# Patient Record
Sex: Male | Born: 1990 | Race: Black or African American | Hispanic: No | Marital: Single | Smoking: Current every day smoker
Health system: Southern US, Community
[De-identification: ages and names within clinical notes are randomized; demographics above are authoritative.]

## PROBLEM LIST (undated history)

## (undated) DIAGNOSIS — J45909 Unspecified asthma, uncomplicated: Secondary | ICD-10-CM

## (undated) HISTORY — PX: TONSILLECTOMY: SUR1361

---
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
## 2009-08-08 ENCOUNTER — Emergency Department (HOSPITAL_COMMUNITY): Admission: EM | Admit: 2009-08-08 | Discharge: 2009-08-08 | Payer: Self-pay | Admitting: Emergency Medicine

## 2010-04-17 ENCOUNTER — Emergency Department (HOSPITAL_COMMUNITY)
Admission: EM | Admit: 2010-04-17 | Discharge: 2010-04-17 | Disposition: A | Discharge status: Home / Self Care | Payer: No Typology Code available for payment source | Attending: Emergency Medicine | Admitting: Emergency Medicine

## 2010-04-17 ENCOUNTER — Emergency Department (HOSPITAL_COMMUNITY)
Admit: 2010-04-17 | Discharge: 2010-04-17 | Disposition: A | Discharge status: Home / Self Care | Payer: No Typology Code available for payment source

## 2010-04-17 ENCOUNTER — Encounter (HOSPITAL_COMMUNITY): Payer: Self-pay

## 2010-04-17 DIAGNOSIS — Z09 Encounter for follow-up examination after completed treatment for conditions other than malignant neoplasm: Secondary | ICD-10-CM | POA: Insufficient documentation

## 2010-04-17 DIAGNOSIS — T07XXXA Unspecified multiple injuries, initial encounter: Secondary | ICD-10-CM | POA: Insufficient documentation

## 2010-04-19 ENCOUNTER — Emergency Department (HOSPITAL_COMMUNITY)
Admission: EM | Admit: 2010-04-19 | Discharge: 2010-04-19 | Disposition: A | Discharge status: Home / Self Care | Payer: No Typology Code available for payment source | Attending: Emergency Medicine | Admitting: Emergency Medicine

## 2010-04-19 DIAGNOSIS — R51 Headache: Secondary | ICD-10-CM | POA: Insufficient documentation

## 2010-04-19 DIAGNOSIS — M545 Low back pain, unspecified: Secondary | ICD-10-CM | POA: Insufficient documentation

## 2010-04-19 DIAGNOSIS — M546 Pain in thoracic spine: Secondary | ICD-10-CM | POA: Insufficient documentation

## 2010-04-19 DIAGNOSIS — J45909 Unspecified asthma, uncomplicated: Secondary | ICD-10-CM | POA: Insufficient documentation

## 2010-04-19 DIAGNOSIS — Y929 Unspecified place or not applicable: Secondary | ICD-10-CM | POA: Insufficient documentation

## 2010-04-19 DIAGNOSIS — T07XXXA Unspecified multiple injuries, initial encounter: Secondary | ICD-10-CM | POA: Insufficient documentation

## 2010-04-19 DIAGNOSIS — M542 Cervicalgia: Secondary | ICD-10-CM | POA: Insufficient documentation

## 2011-12-21 ENCOUNTER — Emergency Department (HOSPITAL_COMMUNITY)
Admission: EM | Admit: 2011-12-21 | Discharge: 2011-12-21 | Disposition: A | Discharge status: Home / Self Care | Payer: Self-pay | Attending: Emergency Medicine | Admitting: Emergency Medicine

## 2011-12-21 ENCOUNTER — Emergency Department (HOSPITAL_COMMUNITY): Payer: Self-pay

## 2011-12-21 ENCOUNTER — Encounter (HOSPITAL_COMMUNITY): Payer: Self-pay | Admitting: *Deleted

## 2011-12-21 DIAGNOSIS — F17200 Nicotine dependence, unspecified, uncomplicated: Secondary | ICD-10-CM | POA: Insufficient documentation

## 2011-12-21 DIAGNOSIS — Y92009 Unspecified place in unspecified non-institutional (private) residence as the place of occurrence of the external cause: Secondary | ICD-10-CM | POA: Insufficient documentation

## 2011-12-21 DIAGNOSIS — S6000XA Contusion of unspecified finger without damage to nail, initial encounter: Secondary | ICD-10-CM | POA: Insufficient documentation

## 2011-12-21 DIAGNOSIS — W230XXA Caught, crushed, jammed, or pinched between moving objects, initial encounter: Secondary | ICD-10-CM | POA: Insufficient documentation

## 2011-12-21 HISTORY — DX: Unspecified asthma, uncomplicated: J45.909

## 2011-12-21 MED ORDER — HYDROCODONE-ACETAMINOPHEN 5-325 MG PO TABS: ORAL_TABLET | ORAL | Status: DC

## 2011-12-21 MED ORDER — NAPROXEN 500 MG PO TABS: 500.0000 mg | ORAL_TABLET | Freq: Two times a day (BID) | ORAL | Status: DC

## 2011-12-21 NOTE — ED Provider Notes (Signed)
Medical screening examination/treatment/procedure(s) were performed by non-physician practitioner and as supervising physician I was immediately available for consultation/collaboration.   Charles B. Sheldon, MD 12/21/11 2135 

## 2011-12-21 NOTE — ED Provider Notes (Signed)
History     CSN: [id number]  Arrival date & time 12/21/11  1534   First MD Initiated Contact with Patient 12/21/11 1606      Chief Complaint  Patient presents with  . Hand Injury    (Consider location/radiation/quality/duration/timing/severity/associated sxs/prior treatment) HPI Comments: Patient c/o injury to his right hand last week and accidentally shut his hand in a car door earlier today.  C/o pain to the PIP joints of the second and third fingers.  Pain to the index finger radiates to the dorsal hand.  Pain is worse with flexion of the fingers.  He has not been taking any medication of the pain.  He denies numbness, bleeding, or wrist pain.    Patient is a 21 y.o. male presenting with hand injury. The history is provided by the patient.  Hand Injury  The incident occurred 3 to 5 hours ago. The incident occurred at home. The injury mechanism was a direct blow. The pain is present in the right fingers. The quality of the pain is described as aching and throbbing. The pain is moderate. The pain has been constant since the incident. Pertinent negatives include no malaise/fatigue. He reports no foreign bodies present. The symptoms are aggravated by movement, use and palpation. He has tried nothing for the symptoms. The treatment provided no relief.    Past Medical History  Diagnosis Date  . Asthma     Past Surgical History  Procedure Date  . Tonsillectomy     History reviewed. No pertinent family history.  History  Substance Use Topics  . Smoking status: Current Every Day Smoker  . Smokeless tobacco: Not on file  . Alcohol Use: No      Review of Systems  Constitutional: Negative for malaise/fatigue, activity change and appetite change.  HENT: Negative for neck pain and neck stiffness.   Respiratory: Negative for shortness of breath.   Cardiovascular: Negative for chest pain.  Gastrointestinal: Negative for nausea and vomiting.  Musculoskeletal: Positive for  arthralgias.  Skin: Negative for rash.       abrasions  Neurological: Negative for dizziness, speech difficulty, weakness, numbness and headaches.  Psychiatric/Behavioral: Negative for confusion and decreased concentration.  All other systems reviewed and are negative.    Allergies  Review of patient's allergies indicates no known allergies.  Home Medications  No current outpatient prescriptions on file.  BP 119/69  Pulse 60  Temp 98.1 F (36.7 C) (Oral)  Resp 20  Ht 5\' 11"  (1.803 m)  Wt 160 lb (72.576 kg)  BMI 22.32 kg/m2  SpO2 100%  Physical Exam  Nursing note and vitals reviewed. Constitutional: He is oriented to person, place, and time. He appears well-developed and well-nourished. No distress.  HENT:  Head: Normocephalic and atraumatic.  Cardiovascular: Normal rate, regular rhythm and normal heart sounds.   Pulmonary/Chest: Effort normal and breath sounds normal.  Musculoskeletal: He exhibits tenderness. He exhibits no edema.       Right hand: He exhibits decreased range of motion, tenderness and bony tenderness. He exhibits normal two-point discrimination, normal capillary refill, no deformity, no laceration and no swelling. normal sensation noted. Normal strength noted.       Hands:      ttp of the PIP joint of the right second and third fingers.  Mild abrasions also present.  No edema.    Radial pulse is brisk, sensation intact.  CR< 2 sec.  No bruising or deformity.    Neurological: He is alert and oriented to  person, place, and time. He exhibits normal muscle tone. Coordination normal.  Skin: Skin is warm and dry.    ED Course  Procedures (including critical care time)  Labs Reviewed - No data to display Dg Finger Index Right  12/21/2011  *RADIOLOGY REPORT*  Clinical Data: Pain post trauma  RIGHT INDEX FINGER 2+V  Comparison: None.  Findings:  Frontal, oblique, and lateral views were obtained.  No fracture or dislocation.  Joint spaces appear intact. No erosive  change.  IMPRESSION: No apparent fracture or dislocation.   Original Report Authenticated By: Arvin Collard. WOODRUFF III, M.D.      Right index and middle finger were buddy taped and splinted.  Pain improved, remains NV intact.     MDM    Contusions to the right second and third fingers at the PIP joints.  Distal sensation intact.  No edema.  Radial pulse brisk.  Pt agrees to f/u with Dr. Hilda Lias   The patient appears reasonably screened and/or stabilized for discharge and I doubt any other medical condition or other [facility] requiring further screening, evaluation, or treatment in the ED at this time prior to discharge.   Prescribed: Naprosyn norco #15    Eveline Sauve L. [place], [place] 12/21/11 1637

## 2011-12-21 NOTE — ED Notes (Signed)
Closed rt hand in door today, and had previously hurt same hand last week, abrasions present.

## 2014-10-21 ENCOUNTER — Emergency Department (HOSPITAL_COMMUNITY): Payer: No Typology Code available for payment source

## 2014-10-21 ENCOUNTER — Encounter (HOSPITAL_COMMUNITY): Payer: Self-pay | Admitting: Emergency Medicine

## 2014-10-21 ENCOUNTER — Emergency Department (HOSPITAL_COMMUNITY)
Admission: EM | Admit: 2014-10-21 | Discharge: 2014-10-21 | Disposition: A | Discharge status: Home / Self Care | Payer: No Typology Code available for payment source | Attending: Emergency Medicine | Admitting: Emergency Medicine

## 2014-10-21 DIAGNOSIS — Y998 Other external cause status: Secondary | ICD-10-CM | POA: Insufficient documentation

## 2014-10-21 DIAGNOSIS — J45909 Unspecified asthma, uncomplicated: Secondary | ICD-10-CM | POA: Diagnosis not present

## 2014-10-21 DIAGNOSIS — S80211A Abrasion, right knee, initial encounter: Secondary | ICD-10-CM | POA: Diagnosis not present

## 2014-10-21 DIAGNOSIS — S8991XA Unspecified injury of right lower leg, initial encounter: Secondary | ICD-10-CM | POA: Diagnosis not present

## 2014-10-21 DIAGNOSIS — Z720 Tobacco use: Secondary | ICD-10-CM | POA: Diagnosis not present

## 2014-10-21 DIAGNOSIS — Z791 Long term (current) use of non-steroidal anti-inflammatories (NSAID): Secondary | ICD-10-CM | POA: Insufficient documentation

## 2014-10-21 DIAGNOSIS — S79912A Unspecified injury of left hip, initial encounter: Secondary | ICD-10-CM | POA: Insufficient documentation

## 2014-10-21 DIAGNOSIS — S6991XA Unspecified injury of right wrist, hand and finger(s), initial encounter: Secondary | ICD-10-CM | POA: Diagnosis not present

## 2014-10-21 DIAGNOSIS — S3992XA Unspecified injury of lower back, initial encounter: Secondary | ICD-10-CM | POA: Diagnosis not present

## 2014-10-21 DIAGNOSIS — Y9301 Activity, walking, marching and hiking: Secondary | ICD-10-CM | POA: Insufficient documentation

## 2014-10-21 DIAGNOSIS — T148 Other injury of unspecified body region: Secondary | ICD-10-CM | POA: Insufficient documentation

## 2014-10-21 DIAGNOSIS — M79641 Pain in right hand: Secondary | ICD-10-CM

## 2014-10-21 DIAGNOSIS — S40211A Abrasion of right shoulder, initial encounter: Secondary | ICD-10-CM | POA: Insufficient documentation

## 2014-10-21 DIAGNOSIS — T07XXXA Unspecified multiple injuries, initial encounter: Secondary | ICD-10-CM

## 2014-10-21 DIAGNOSIS — S59901A Unspecified injury of right elbow, initial encounter: Secondary | ICD-10-CM | POA: Insufficient documentation

## 2014-10-21 DIAGNOSIS — Y92410 Unspecified street and highway as the place of occurrence of the external cause: Secondary | ICD-10-CM | POA: Diagnosis not present

## 2014-10-21 DIAGNOSIS — S4991XA Unspecified injury of right shoulder and upper arm, initial encounter: Secondary | ICD-10-CM | POA: Diagnosis present

## 2014-10-21 DIAGNOSIS — R000 Tachycardia, unspecified: Secondary | ICD-10-CM | POA: Diagnosis not present

## 2014-10-21 MED ORDER — DIAZEPAM 5 MG PO TABS
5.0000 mg | ORAL_TABLET | Freq: Once | ORAL | Status: AC
  Administered 2014-10-21: 5 mg via ORAL
  Filled 2014-10-21: qty 1

## 2014-10-21 MED ORDER — IBUPROFEN 600 MG PO TABS: 600.0000 mg | ORAL_TABLET | Freq: Four times a day (QID) | ORAL | Status: DC | PRN

## 2014-10-21 MED ORDER — HYDROCODONE-ACETAMINOPHEN 5-325 MG PO TABS
2.0000 | ORAL_TABLET | Freq: Once | ORAL | Status: AC
  Administered 2014-10-21: 2 via ORAL
  Filled 2014-10-21: qty 2

## 2014-10-21 MED ORDER — BACLOFEN 10 MG PO TABS: 10.0000 mg | ORAL_TABLET | Freq: Three times a day (TID) | ORAL | Status: AC

## 2014-10-21 MED ORDER — ACETAMINOPHEN-CODEINE #3 300-30 MG PO TABS: 1.0000 | ORAL_TABLET | Freq: Four times a day (QID) | ORAL | Status: DC | PRN
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
## 2014-10-21 NOTE — Discharge Instructions (Signed)
Your x-rays are negative for fracture, or dislocation. There no gross neurologic or vascular emergencies noted on tonight's examination. You can expect increased soreness over the next 3-5 days. Please use baclofen and ibuprofen daily. Baclofen may cause drowsiness, please use with caution. May use Tylenol codeine for more severe pain. This medication may cause drowsiness, please use with caution. Abrasions An abrasion is a cut or scrape of the skin. Abrasions do not go through all layers of the skin. HOME CARE  If a bandage (dressing) was put on your wound, change it as told by your doctor. If the bandage sticks, soak it off with warm.  Wash the area with water and soap 2 times a day. Rinse off the soap. Pat the area dry with a clean towel.  Put on medicated cream (ointment) as told by your doctor.  Change your bandage right away if it gets wet or dirty.  Only take medicine as told by your doctor.  See your doctor within 24-48 hours to get your wound checked.  Check your wound for redness, puffiness (swelling), or yellowish-white fluid (pus). GET HELP RIGHT AWAY IF:   You have more pain in the wound.  You have redness, swelling, or tenderness around the wound.  You have pus coming from the wound.  You have a fever or lasting symptoms for more than 2-3 days.  You have a fever and your symptoms suddenly get worse.  You have a bad smell coming from the wound or bandage. MAKE SURE YOU:   Understand these instructions.  Will watch your condition.  Will get help right away if you are not doing well or get worse. Document Released: 08/19/2007 Document Revised: 11/25/2011 Document Reviewed: 02/03/2011 [facility] Patient Information 2015 [place], [place]. This information is not intended to replace advice given to you by your health care provider. Make sure you discuss any questions you have with your health care provider.
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
## 2016-05-02 ENCOUNTER — Encounter (HOSPITAL_COMMUNITY): Payer: Self-pay | Admitting: Emergency Medicine

## 2016-05-02 ENCOUNTER — Emergency Department (HOSPITAL_COMMUNITY)
Admission: EM | Admit: 2016-05-02 | Discharge: 2016-05-02 | Disposition: A | Discharge status: Home / Self Care | Payer: Self-pay | Attending: Emergency Medicine | Admitting: Emergency Medicine

## 2016-05-02 DIAGNOSIS — R1110 Vomiting, unspecified: Secondary | ICD-10-CM | POA: Insufficient documentation

## 2016-05-02 DIAGNOSIS — Z791 Long term (current) use of non-steroidal anti-inflammatories (NSAID): Secondary | ICD-10-CM | POA: Insufficient documentation

## 2016-05-02 DIAGNOSIS — J45909 Unspecified asthma, uncomplicated: Secondary | ICD-10-CM | POA: Insufficient documentation

## 2016-05-02 DIAGNOSIS — R197 Diarrhea, unspecified: Secondary | ICD-10-CM | POA: Insufficient documentation

## 2016-05-02 DIAGNOSIS — F17210 Nicotine dependence, cigarettes, uncomplicated: Secondary | ICD-10-CM | POA: Insufficient documentation

## 2016-05-02 MED ORDER — LOPERAMIDE HCL 2 MG PO CAPS
2.0000 mg | ORAL_CAPSULE | Freq: Four times a day (QID) | ORAL | 0 refills | Status: AC | PRN
Start: 2016-05-02 — End: ?

## 2016-05-02 MED ORDER — PROMETHAZINE HCL 25 MG PO TABS: 25.0000 mg | ORAL_TABLET | Freq: Four times a day (QID) | ORAL | 0 refills | Status: DC | PRN

## 2016-05-02 NOTE — Discharge Instructions (Signed)
Drink plenty of Fluids, please review the discharge instructions regarding foods he should eat, return to the emergency room for fever or worsening symptoms

## 2016-05-02 NOTE — ED Provider Notes (Signed)
[facility]-EMERGENCY DEPT Provider Note   CSN: [id number] Arrival date & time: 05/02/16  1744 By signing my name below, I, Raymond Forbes, attest that this documentation has been prepared under the direction and in the presence of Linwood Dibbles, MD. Electronically Signed: Bridgette Forbes, ED Scribe. 05/02/16. 8:40 PM.  History   Chief Complaint Chief Complaint  Patient presents with  . Emesis    HPI The history is provided by the patient. No language interpreter was used.  HPI Comments: Raymond Forbes is a 26 y.o. male with h/o asthma, who presents to the Emergency Department complaining of persistent diarrhea beginning this morning. Pt additionally notes he has vomited (x2 episodes) this morning but haven't vomited since. He is able to keep food and fluids down. He has not tried any OTC medications PTA. Pt states multiple people were sick at work. No recent visit out of the country or suspicious food intake. Pt denies fever, chills, abdominal pain, nausea, or any other associated symptoms. Pt is in no pain at this time.  Past Medical History:  Diagnosis Date  . Asthma     There are no active problems to display for this patient.   Past Surgical History:  Procedure Laterality Date  . TONSILLECTOMY         Home Medications    Prior to Admission medications   Medication Sig Start Date End Date Taking? Authorizing Provider  acetaminophen-codeine (TYLENOL #3) 300-30 MG per tablet Take 1-2 tablets by mouth every 6 (six) hours as needed for moderate pain. 10/21/14   Ivery Quale, PA-C  ibuprofen (ADVIL,MOTRIN) 600 MG tablet Take 1 tablet (600 mg total) by mouth every 6 (six) hours as needed. 10/21/14   Ivery Quale, PA-C  loperamide (IMODIUM) 2 MG capsule Take 1 capsule (2 mg total) by mouth 4 (four) times daily as needed for diarrhea or loose stools. 05/02/16   Linwood Dibbles, MD  promethazine (PHENERGAN) 25 MG tablet Take 1 tablet (25 mg total) by mouth every 6 (six) hours as needed for nausea or  vomiting. 05/02/16   Linwood Dibbles, MD    Family History History reviewed. No pertinent family history.  Social History Social History  Substance Use Topics  . Smoking status: Current Every Day Smoker    Packs/day: 1.00    Years: 10.00    Types: Cigarettes  . Smokeless tobacco: Never Used  . Alcohol use No     Allergies   Patient has no known allergies.   Review of Systems Review of Systems  Constitutional: Negative for chills and fever.  Gastrointestinal: Negative for abdominal pain and nausea.  All other systems reviewed and are negative.    Physical Exam Updated Vital Signs BP 126/72 (BP Location: Left Arm)   Pulse 77   Temp 98.9 F (37.2 C) (Oral)   Resp 18   Ht 5\' 10"  (1.778 m)   Wt 61.2 kg   SpO2 100%   BMI 19.37 kg/m   Physical Exam  Constitutional: He appears well-developed and well-nourished.  HENT:  Head: Normocephalic.  Mouth/Throat: Oropharynx is clear and moist and mucous membranes are normal.  Eyes: Conjunctivae are normal.  Cardiovascular: Normal rate.   Pulmonary/Chest: Effort normal. No respiratory distress.  Abdominal: He exhibits no distension.  Musculoskeletal: Normal range of motion.  Neurological: He is alert.  Skin: Skin is warm and dry.  Psychiatric: He has a normal mood and affect. His behavior is normal.  Nursing note and vitals reviewed.    ED Treatments /  Results  DIAGNOSTIC STUDIES: Oxygen Saturation is 100% on RA, normal by my interpretation.    COORDINATION OF CARE: 8:39 PM Discussed treatment plan with pt at bedside and pt agreed to plan.   Procedures Procedures (including critical care time)    Initial Impression / Assessment and Plan / ED Course  I have reviewed the triage vital signs and the nursing notes.  Pertinent labs & imaging results that were available during my care of the patient were reviewed by me and considered in my medical decision making (see chart for details).   the patient appears  well-hydrated. He has not had any vomiting since this morning. He has no abdominal tenderness on exam. I suspect a viral gastroenteritis. Patient works in [employer]. Plan on discharge home but will have him stay out of work for the next couple of days. Recommend Imodium for diarrhea and he can take Phenergan for nausea. He should return for worsening symptoms, fever, abdominal pain.  Final Clinical Impressions(s) / ED Diagnoses   Final diagnoses:  Vomiting and diarrhea    New Prescriptions New Prescriptions   LOPERAMIDE (IMODIUM) 2 MG CAPSULE    Take 1 capsule (2 mg total) by mouth 4 (four) times daily as needed for diarrhea or loose stools.   PROMETHAZINE (PHENERGAN) 25 MG TABLET    Take 1 tablet (25 mg total) by mouth every 6 (six) hours as needed for nausea or vomiting.   I personally performed the services described in this documentation, which was scribed in my presence.  The recorded information has been reviewed and is accurate.    Linwood DibblesJon Jesua Tamblyn, MD 05/02/16 [phone]

## 2016-06-25 ENCOUNTER — Emergency Department (HOSPITAL_COMMUNITY)
Admission: EM | Admit: 2016-06-25 | Discharge: 2016-06-25 | Disposition: A | Discharge status: Home / Self Care | Payer: Self-pay | Attending: Dermatology | Admitting: Dermatology

## 2016-06-25 ENCOUNTER — Encounter (HOSPITAL_COMMUNITY): Payer: Self-pay

## 2016-06-25 DIAGNOSIS — F17210 Nicotine dependence, cigarettes, uncomplicated: Secondary | ICD-10-CM | POA: Insufficient documentation

## 2016-06-25 DIAGNOSIS — M25511 Pain in right shoulder: Secondary | ICD-10-CM | POA: Insufficient documentation

## 2016-06-25 DIAGNOSIS — J45909 Unspecified asthma, uncomplicated: Secondary | ICD-10-CM | POA: Insufficient documentation

## 2016-06-25 DIAGNOSIS — Z5321 Procedure and treatment not carried out due to patient leaving prior to being seen by health care provider: Secondary | ICD-10-CM | POA: Insufficient documentation

## 2016-06-25 NOTE — ED Triage Notes (Signed)
Pt reports lifting boxes up to 80 lbs at work yesterday. Woke up this morning with pain right shoulder

## 2017-05-03 ENCOUNTER — Encounter (HOSPITAL_COMMUNITY): Payer: Self-pay | Admitting: Emergency Medicine

## 2017-05-03 ENCOUNTER — Other Ambulatory Visit: Payer: Self-pay

## 2017-05-03 ENCOUNTER — Emergency Department (HOSPITAL_COMMUNITY)
Admission: EM | Admit: 2017-05-03 | Discharge: 2017-05-03 | Disposition: A | Discharge status: Home / Self Care | Payer: Self-pay | Attending: Emergency Medicine | Admitting: Emergency Medicine

## 2017-05-03 DIAGNOSIS — F17210 Nicotine dependence, cigarettes, uncomplicated: Secondary | ICD-10-CM | POA: Insufficient documentation

## 2017-05-03 DIAGNOSIS — Z79899 Other long term (current) drug therapy: Secondary | ICD-10-CM | POA: Insufficient documentation

## 2017-05-03 DIAGNOSIS — L02412 Cutaneous abscess of left axilla: Secondary | ICD-10-CM | POA: Insufficient documentation

## 2017-05-03 MED ORDER — SULFAMETHOXAZOLE-TRIMETHOPRIM 800-160 MG PO TABS: 1.0000 | ORAL_TABLET | Freq: Two times a day (BID) | ORAL | 0 refills | Status: AC

## 2017-05-03 MED ORDER — LIDOCAINE HCL (PF) 1 % IJ SOLN
5.0000 mL | Freq: Once | INTRAMUSCULAR | Status: AC
  Administered 2017-05-03: 5 mL
  Filled 2017-05-03: qty 6

## 2017-05-03 MED ORDER — POVIDONE-IODINE 10 % EX SOLN
CUTANEOUS | Status: AC
  Filled 2017-05-03: qty 15

## 2017-05-03 MED ORDER — HYDROCODONE-ACETAMINOPHEN 5-325 MG PO TABS
1.0000 | ORAL_TABLET | Freq: Once | ORAL | Status: AC
  Administered 2017-05-03: 1 via ORAL
  Filled 2017-05-03: qty 1

## 2017-05-03 MED ORDER — HYDROCODONE-ACETAMINOPHEN 5-325 MG PO TABS: 1.0000 | ORAL_TABLET | ORAL | 0 refills | Status: DC | PRN

## 2017-05-03 NOTE — ED Provider Notes (Signed)
[facility]   CSN: [id number] Arrival date & time: 05/03/17  1159     History   Chief Complaint Chief Complaint  Patient presents with  . Abscess    HPI Leonette MonarchRashawn S Bergen is a 27 y.o. male.  The history is provided by the patient.  Abscess  Location:  Shoulder/arm Shoulder/arm abscess location:  L axilla Size:  3 cm Abscess quality: fluctuance and painful   Abscess quality: not draining, no induration and no redness   Red streaking: no   Duration:  2 days Progression:  Worsening Pain details:    Quality:  Sharp   Severity:  Moderate   Timing:  Constant   Progression:  Worsening Chronicity:  Recurrent (had a previous abscess several months ago at an inferior location same axilla which drained spontaneously.) Context: not diabetes and not immunosuppression   Relieved by:  Nothing Worsened by:  Draining/squeezing Ineffective treatments:  Warm compresses Associated symptoms: no fever, no nausea and no vomiting   Risk factors: no hx of MRSA     Past Medical History:  Diagnosis Date  . Asthma     There are no active problems to display for this patient.   Past Surgical History:  Procedure Laterality Date  . TONSILLECTOMY         Home Medications    Prior to Admission medications   Medication Sig Start Date End Date Taking? Authorizing Provider  acetaminophen-codeine (TYLENOL #3) 300-30 MG per tablet Take 1-2 tablets by mouth every 6 (six) hours as needed for moderate pain. 10/21/14   Ivery QualeBryant, Hobson, PA-C  HYDROcodone-acetaminophen (NORCO/VICODIN) 5-325 MG tablet Take 1 tablet by mouth every 4 (four) hours as needed. 05/03/17   Elisabetta Mishra, Raynelle FanningJulie, PA-C  ibuprofen (ADVIL,MOTRIN) 600 MG tablet Take 1 tablet (600 mg total) by mouth every 6 (six) hours as needed. 10/21/14   Ivery QualeBryant, Hobson, PA-C  loperamide (IMODIUM) 2 MG capsule Take 1 capsule (2 mg total) by mouth 4 (four) times daily as needed for diarrhea or loose stools. 05/02/16    Linwood DibblesKnapp, Jon, MD  promethazine (PHENERGAN) 25 MG tablet Take 1 tablet (25 mg total) by mouth every 6 (six) hours as needed for nausea or vomiting. 05/02/16   Linwood DibblesKnapp, Jon, MD  sulfamethoxazole-trimethoprim (BACTRIM DS,SEPTRA DS) 800-160 MG tablet Take 1 tablet by mouth 2 (two) times daily for 10 days. 05/03/17 05/13/17  Burgess AmorIdol, Chrishun Scheer, PA-C    Family History History reviewed. No pertinent family history.  Social History Social History   Tobacco Use  . Smoking status: Current Every Day Smoker    Packs/day: 0.50    Years: 10.00    Pack years: 5.00    Types: Cigarettes  . Smokeless tobacco: Never Used  Substance Use Topics  . Alcohol use: No  . Drug use: No     Allergies   Patient has no known allergies.   Review of Systems Review of Systems  Constitutional: Negative for chills and fever.  Respiratory: Negative.   Gastrointestinal: Negative for nausea and vomiting.  Skin:       Negative except as mentioned in HPI.   Neurological: Negative for numbness.     Physical Exam Updated Vital Signs BP 132/85 (BP Location: Right Arm)   Pulse 65   Temp 98.5 F (36.9 C) (Oral)   Resp 18   Ht 5\' 11"  (1.803 m)   Wt 61.2 kg (135 lb)   SpO2 100%   BMI 18.83 kg/m   Physical Exam  Constitutional:  He is oriented to person, place, and time. He appears well-developed and well-nourished.  HENT:  Head: Normocephalic.  Cardiovascular: Normal rate.  Pulmonary/Chest: Effort normal.  Musculoskeletal: He exhibits tenderness.  Neurological: He is alert and oriented to person, place, and time. No sensory deficit.  Skin: Laceration noted.     ED Treatments / Results  Labs (all labs ordered are listed, but only abnormal results are displayed) Labs Reviewed - No data to display  EKG  EKG Interpretation None       Radiology No results found.  Procedures Procedures (including critical care time)  INCISION AND DRAINAGE Performed by: Burgess Amor Consent: Verbal consent  obtained. Risks and benefits: risks, benefits and alternatives were discussed Type: abscess  Body area: left axilla  Anesthesia: local infiltration  Incision was made with a scalpel.  Local anesthetic: lidocaine 1% without epinephrine  Anesthetic total: 4 ml  Complexity: complex Blunt dissection to break up loculations  Drainage: purulent  Drainage amount: copious  Packing material: none  Patient tolerance: Patient tolerated the procedure well with no immediate complications.     Medications Ordered in ED Medications  lidocaine (PF) (XYLOCAINE) 1 % injection 5 mL (not administered)  povidone-iodine (BETADINE) 10 % external solution (not administered)     Initial Impression / Assessment and Plan / ED Course  I have reviewed the triage vital signs and the nursing notes.  Pertinent labs & imaging results that were available during my care of the patient were reviewed by me and considered in my medical decision making (see chart for details).     Discussed I&D aftercare. Hydrocodone, bactrim prescribed.  Hot water soaks, massage, recheck here if the site does not continue to resolve.   Final Clinical Impressions(s) / ED Diagnoses   Final diagnoses:  Abscess of left axilla    ED Discharge Orders        Ordered    sulfamethoxazole-trimethoprim (BACTRIM DS,SEPTRA DS) 800-160 MG tablet  2 times daily     05/03/17 1611    HYDROcodone-acetaminophen (NORCO/VICODIN) 5-325 MG tablet  Every 4 hours PRN     05/03/17 1611       Burgess Amor, PA-C 05/03/17 1626    Mesner, Barbara Cower, MD 05/04/17 [phone]

## 2017-05-03 NOTE — ED Triage Notes (Signed)
Abscess under left arm for two days.

## 2017-05-03 NOTE — Discharge Instructions (Signed)
Complete your entire course of the antibiotics prescribed.  You may take the hydrocodone prescribed for pain relief.  This will make you drowsy - do not drive within 4 hours of taking this medication.  As discussed, continue to apply hot water compresses or better, stand in a shower letting the gentle shower water massage this area for 10 minutes twice daily, then dry and apply a new dressing.

## 2021-08-22 ENCOUNTER — Encounter (HOSPITAL_COMMUNITY): Payer: Self-pay | Admitting: Emergency Medicine

## 2021-08-22 ENCOUNTER — Other Ambulatory Visit: Payer: Self-pay

## 2021-08-22 ENCOUNTER — Emergency Department (HOSPITAL_COMMUNITY)
Admission: EM | Admit: 2021-08-22 | Discharge: 2021-08-22 | Disposition: A | Discharge status: Home / Self Care | Payer: Self-pay | Attending: Emergency Medicine | Admitting: Emergency Medicine

## 2021-08-22 DIAGNOSIS — L02412 Cutaneous abscess of left axilla: Secondary | ICD-10-CM | POA: Insufficient documentation

## 2021-08-22 DIAGNOSIS — Z23 Encounter for immunization: Secondary | ICD-10-CM | POA: Insufficient documentation

## 2021-08-22 MED ORDER — POVIDONE-IODINE 10 % EX SOLN
CUTANEOUS | Status: AC
  Filled 2021-08-22: qty 14.8

## 2021-08-22 MED ORDER — DOXYCYCLINE HYCLATE 100 MG PO CAPS: 100.0000 mg | ORAL_CAPSULE | Freq: Two times a day (BID) | ORAL | 0 refills | Status: DC

## 2021-08-22 MED ORDER — POVIDONE-IODINE 10 % EX OINT
TOPICAL_OINTMENT | Freq: Once | CUTANEOUS | Status: AC
  Administered 2021-08-22: 1 via TOPICAL
  Filled 2021-08-22: qty 28.35

## 2021-08-22 MED ORDER — LIDOCAINE HCL (PF) 1 % IJ SOLN
10.0000 mL | Freq: Once | INTRAMUSCULAR | Status: AC
  Administered 2021-08-22: 10 mL
  Filled 2021-08-22: qty 10

## 2021-08-22 MED ORDER — TETANUS-DIPHTH-ACELL PERTUSSIS 5-2.5-18.5 LF-MCG/0.5 IM SUSY
0.5000 mL | PREFILLED_SYRINGE | Freq: Once | INTRAMUSCULAR | Status: AC
  Administered 2021-08-22: 0.5 mL via INTRAMUSCULAR
  Filled 2021-08-22: qty 0.5

## 2021-08-22 MED ORDER — IBUPROFEN 400 MG PO TABS
600.0000 mg | ORAL_TABLET | Freq: Once | ORAL | Status: AC
  Administered 2021-08-22: 600 mg via ORAL
  Filled 2021-08-22: qty 2

## 2021-08-22 NOTE — Discharge Instructions (Signed)
Take Doxy twice daily for the next 1 days.  Call above number to schedule follow-up with general surgery.  Do warm compresses for the abscess underarm.

## 2021-08-22 NOTE — ED Provider Notes (Signed)
[facility] EMERGENCY DEPARTMENT Provider Note   CSN: [id number] Arrival date & time: 08/22/21  1903     History  Chief Complaint  Patient presents with   Abscess    Raymond Forbes is a 31 y.o. male.   Abscess   Patient presents due to abscess to left axilla.  This happened 4 days ago, its been progressively increasing in size.  It is painful, no drainage yet.  Denies any fevers at home, history of the same in 2019.  Unsure when his last tetanus was.  Home Medications Prior to Admission medications   Medication Sig Start Date End Date Taking? Authorizing Provider  doxycycline (VIBRAMYCIN) 100 MG capsule Take 1 capsule (100 mg total) by mouth 2 (two) times daily. 08/22/21  Yes Theron Arista, PA-C  acetaminophen-codeine (TYLENOL #3) 300-30 MG per tablet Take 1-2 tablets by mouth every 6 (six) hours as needed for moderate pain. 10/21/14   Ivery Quale, PA-C  HYDROcodone-acetaminophen (NORCO/VICODIN) 5-325 MG tablet Take 1 tablet by mouth every 4 (four) hours as needed. 05/03/17   Idol, Raynelle Fanning, PA-C  ibuprofen (ADVIL,MOTRIN) 600 MG tablet Take 1 tablet (600 mg total) by mouth every 6 (six) hours as needed. 10/21/14   Ivery Quale, PA-C  loperamide (IMODIUM) 2 MG capsule Take 1 capsule (2 mg total) by mouth 4 (four) times daily as needed for diarrhea or loose stools. 05/02/16   Linwood Dibbles, MD  promethazine (PHENERGAN) 25 MG tablet Take 1 tablet (25 mg total) by mouth every 6 (six) hours as needed for nausea or vomiting. 05/02/16   Linwood Dibbles, MD      Allergies    Patient has no known allergies.    Review of Systems   Review of Systems  Physical Exam Updated Vital Signs BP 134/88 (BP Location: Right Arm)   Pulse (!) 108   Temp 97.8 F (36.6 C) (Oral)   Resp 17   Ht 5\' 11"  (1.803 m)   Wt 63.5 kg   SpO2 100%   BMI 19.53 kg/m  Physical Exam Vitals and nursing note reviewed. Exam conducted with a chaperone present.  Constitutional:      General: He is not in acute distress.     Appearance: Normal appearance.  HENT:     Head: Normocephalic and atraumatic.  Eyes:     General: No scleral icterus.    Extraocular Movements: Extraocular movements intact.     Pupils: Pupils are equal, round, and reactive to light.  Skin:    Coloration: Skin is not jaundiced.     Comments: 4 cm abscess to left axilla with fluctuance and some surrounding induration.  No overlying erythema  Neurological:     Mental Status: He is alert. Mental status is at baseline.     Coordination: Coordination normal.     ED Results / Procedures / Treatments   Labs (all labs ordered are listed, but only abnormal results are displayed) Labs Reviewed - No data to display  EKG None  Radiology No results found.  Procedures . Incision and Drainage  Date/Time: 08/22/2021 10:32 PM  Performed by: 10/22/2021, PA-C Authorized by: Theron Arista, PA-C   Consent:    Consent obtained:  Verbal   Consent given by:  Patient   Risks discussed:  Bleeding, incomplete drainage, pain and damage to other organs   Alternatives discussed:  No treatment Universal protocol:    Procedure explained and questions answered to patient or proxy's satisfaction: yes     Relevant documents present  and verified: yes     Test results available : yes     Imaging studies available: yes     Required blood products, implants, devices, and special equipment available: yes     Site/side marked: yes     Immediately prior to procedure, a time out was called: yes     Patient identity confirmed:  Verbally with patient Location:    Type:  Abscess   Size:  4 cm   Location:  Upper extremity   Upper extremity location:  Arm   Arm location:  L upper arm Pre-procedure details:    Skin preparation:  Betadine Anesthesia:    Anesthesia method:  Local infiltration   Local anesthetic:  Lidocaine 1% w/o epi Procedure type:    Complexity:  Complex Procedure details:    Ultrasound guidance: no     Needle aspiration: no     Incision  types:  Single straight   Incision depth:  Subcutaneous   Wound management:  Probed and deloculated, irrigated with saline and extensive cleaning   Drainage:  Purulent   Drainage amount:  Moderate   Packing materials:  None Post-procedure details:    Procedure completion:  Tolerated well, no immediate complications     Medications Ordered in ED Medications  povidone-iodine (BETADINE) 10 % external solution (  See Procedure Record 08/22/21 2156)  lidocaine (PF) (XYLOCAINE) 1 % injection 10 mL (10 mLs Infiltration Given 08/22/21 2109)  ibuprofen (ADVIL) tablet 600 mg (600 mg Oral Given 08/22/21 2110)  Tdap (BOOSTRIX) injection 0.5 mL (0.5 mLs Intramuscular Given 08/22/21 2109)  povidone-iodine (BETADINE) 10 % ointment (1 application  Topical Given 08/22/21 2114)    ED Course/ Medical Decision Making/ A&P                           Medical Decision Making Risk OTC drugs. Prescription drug management.   Patient presents due to abscess to left axilla.  Will update tetanus.  Patient tolerated I&D procedure well.  Packing not indicated based on size.  I provided patient referral for general surgery given recurrent left axillary abscesses, also discharge with antibiotics in case of recurrence.  Patient was discharged in stable condition.        Final Clinical Impression(s) / ED Diagnoses Final diagnoses:  Abscess of left axilla    Rx / DC Orders ED Discharge Orders          Ordered    doxycycline (VIBRAMYCIN) 100 MG capsule  2 times daily        08/22/21 2231              Theron Arista, PA-C 08/22/21 2233    Cheryll Cockayne, MD 09/01/21 1630

## 2021-08-22 NOTE — ED Triage Notes (Signed)
Pt c/o abscess to left arm pit x 4 days.

## 2024-03-26 ENCOUNTER — Emergency Department (HOSPITAL_COMMUNITY)
Admission: EM | Admit: 2024-03-26 | Discharge: 2024-03-26 | Disposition: A | Discharge status: Home / Self Care | Payer: Self-pay | Attending: Emergency Medicine | Admitting: Emergency Medicine

## 2024-03-26 ENCOUNTER — Encounter (HOSPITAL_COMMUNITY): Payer: Self-pay

## 2024-03-26 ENCOUNTER — Other Ambulatory Visit: Payer: Self-pay

## 2024-03-26 DIAGNOSIS — R059 Cough, unspecified: Secondary | ICD-10-CM | POA: Insufficient documentation

## 2024-03-26 DIAGNOSIS — J111 Influenza due to unidentified influenza virus with other respiratory manifestations: Secondary | ICD-10-CM

## 2024-03-26 DIAGNOSIS — R112 Nausea with vomiting, unspecified: Secondary | ICD-10-CM | POA: Insufficient documentation

## 2024-03-26 DIAGNOSIS — R197 Diarrhea, unspecified: Secondary | ICD-10-CM | POA: Insufficient documentation

## 2024-03-26 DIAGNOSIS — R509 Fever, unspecified: Secondary | ICD-10-CM | POA: Insufficient documentation

## 2024-03-26 MED ORDER — OSELTAMIVIR PHOSPHATE 75 MG PO CAPS: 75.0000 mg | ORAL_CAPSULE | Freq: Two times a day (BID) | ORAL | 0 refills | Status: AC

## 2024-03-26 MED ORDER — ONDANSETRON HCL 4 MG PO TABS: 4.0000 mg | ORAL_TABLET | Freq: Four times a day (QID) | ORAL | 0 refills | Status: AC

## 2024-03-26 MED ORDER — BENZONATATE 100 MG PO CAPS: 200.0000 mg | ORAL_CAPSULE | Freq: Two times a day (BID) | ORAL | 0 refills | Status: AC | PRN

## 2024-03-26 NOTE — Discharge Instructions (Addendum)
 You would likely be sick for about a week, you can take the medications that I have prescribed including the Tamiflu  twice a day for 5 days, Zofran  every 6 hours as needed for nausea, benzonatate  cough medication 2-3 times per day as needed for coughing.  I would also recommend over-the-counter cough and cold medications, you will be sick for 5 to 10 days

## 2024-03-26 NOTE — ED Triage Notes (Signed)
 Pt reports having flu sx that started Friday, hot/cold, sweating, shivering, headache, runny nose and cough. Pt here with daughter who also has flu

## 2024-03-26 NOTE — ED Provider Notes (Signed)
 " [facility] EMERGENCY DEPARTMENT AT [facility] Provider Note   CSN: [id number] Arrival date & time: 03/26/24  2144     Patient presents with: Influenza   Raymond Forbes is a 34 y.o. male.    Influenza  This patient is a 34 year old male who states he is otherwise healthy, he does endorse smoking, he has had 2 days of symptoms including fevers chills body aches nausea vomiting diarrhea with a little runny nose cough here and there.  He has a daughter who was diagnosed with influenza a couple of days ago at an outside hospital.    Prior to Admission medications  Medication Sig Start Date End Date Taking? Authorizing Provider  benzonatate  (TESSALON ) 100 MG capsule Take 2 capsules (200 mg total) by mouth 2 (two) times daily as needed for cough. 03/26/24  Yes Cleotilde Rogue, MD  ondansetron  (ZOFRAN ) 4 MG tablet Take 1 tablet (4 mg total) by mouth every 6 (six) hours. 03/26/24  Yes Cleotilde Rogue, MD  oseltamivir  (TAMIFLU ) 75 MG capsule Take 1 capsule (75 mg total) by mouth every 12 (twelve) hours. 03/26/24  Yes Cleotilde Rogue, MD  loperamide  (IMODIUM ) 2 MG capsule Take 1 capsule (2 mg total) by mouth 4 (four) times daily as needed for diarrhea or loose stools. 05/02/16   Randol Simmonds, MD    Allergies: Patient has no known allergies.    Review of Systems  All other systems reviewed and are negative.   Updated Vital Signs BP 121/87   Pulse 81   Temp 98 F (36.7 C) (Oral)   Resp 19   Ht 1.803 m (5' 11)   Wt 63.5 kg   SpO2 100%   BMI 19.52 kg/m   Physical Exam Vitals and nursing note reviewed.  Constitutional:      General: He is not in acute distress.    Appearance: He is well-developed.  HENT:     Head: Normocephalic and atraumatic.     Mouth/Throat:     Mouth: Mucous membranes are moist.     Pharynx: No oropharyngeal exudate.  Eyes:     General: No scleral icterus.       Right eye: No discharge.        Left eye: No discharge.     Conjunctiva/sclera:  Conjunctivae normal.     Pupils: Pupils are equal, round, and reactive to light.  Neck:     Thyroid: No thyromegaly.     Vascular: No JVD.  Cardiovascular:     Rate and Rhythm: Normal rate and regular rhythm.     Heart sounds: Normal heart sounds. No murmur heard.    No friction rub. No gallop.  Pulmonary:     Effort: Pulmonary effort is normal. No respiratory distress.     Breath sounds: Normal breath sounds. No wheezing or rales.  Abdominal:     General: Bowel sounds are normal. There is no distension.     Palpations: Abdomen is soft. There is no mass.     Tenderness: There is no abdominal tenderness.  Musculoskeletal:        General: No tenderness. Normal range of motion.     Cervical back: Normal range of motion and neck supple.  Lymphadenopathy:     Cervical: No cervical adenopathy.  Skin:    General: Skin is warm and dry.     Findings: No erythema or rash.  Neurological:     Mental Status: He is alert.     Coordination: Coordination normal.  Psychiatric:        Behavior: Behavior normal.     (all labs ordered are listed, but only abnormal results are displayed) Labs Reviewed - No data to display  EKG: None  Radiology: No results found.   Procedures   Medications Ordered in the ED - No data to display                                  Medical Decision Making  Totally normal exam, unremarkable appearance, vital signs unremarkable, influenza positive exposure now with classic symptoms, Tamiflu  at home, patient was informed to expect about 7 to 10 days of illness and to use over-the-counter cough and cold medications as needed     Final diagnoses:  Influenza-like illness    ED Discharge Orders          Ordered    oseltamivir  (TAMIFLU ) 75 MG capsule  Every 12 hours        03/26/24 2209    ondansetron  (ZOFRAN ) 4 MG tablet  Every 6 hours        03/26/24 2209    benzonatate  (TESSALON ) 100 MG capsule  2 times daily PRN        03/26/24 2209                Cleotilde Rogue, MD 03/26/24 2210  "
